# Patient Record
Sex: Male | Born: 1990 | Race: Black or African American | Hispanic: No | Marital: Single | Smoking: Never smoker
Health system: Southern US, Community
[De-identification: ages and names within clinical notes are randomized; demographics above are authoritative.]

---
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
## 2011-03-16 NOTE — Op Note (Signed)
NAME:  Andrew Camacho, BANKHEAD                          ACCOUNT NO.:  [id number]   MEDICAL RECORD NO.:  [id number]                   PATIENT TYPE:  OBV   LOCATION:  1849                                 FACILITY:  [facility]   PHYSICIAN:  Madlyn Frankel. Charlann Boxer, M.D.               DATE OF BIRTH:  1991/07/06   DATE OF PROCEDURE:  05/29/2004  DATE OF DISCHARGE:                                 OPERATIVE REPORT   PREOPERATIVE DIAGNOSIS:  Right great toe partial traumatic amputation, lawn  mower injury.   POSTOPERATIVE DIAGNOSIS:  Right great toe partial traumatic amputation, lawn  mower injury.   OPERATION PERFORMED:  Incision and drainage of skin and subcutaneous tissue  and bone with revision, amputation and primary wound closure.   SURGEON:  Madlyn Frankel. Charlann Boxer, M.D.   ASSISTANT:  None.   ANESTHESIA:  General.   ESTIMATED BLOOD LOSS:  Minimal.   COMPLICATIONS:  None apparent.   INDICATIONS FOR PROCEDURE:  [name] is a very pleasant 20 year old black  male who was cutting his yard today when he pulled the lawnmower back over  the top of his right great toe.  There was a partial amputation of the  dorsal distal aspect of the right great toe removing the base of the entire  nail bed and a portion of the distal phalanx.  He was brought to the  emergency room for evaluation.  Radiographs revealed a partial amputation of  the distal phalanx with complete loss of his nail bed on the great toe, no  other injury sustained.  After review of the risks and benefits with the  father for incision and drainage and primary closure versus secondary  closure, consent was obtained by the father.   DESCRIPTION OF PROCEDURE:  The patient received Unasyn 3 g IV as well as  gentamicin for his open wounds and fracture.  The patient was brought to the  operating theater.  Once adequate anesthesia was established, the right  lower extremity was prepped and draped in sterile fashion.  The skin was  circumferentially excised  around his wound.  Plan was to have a plantar flap  to be reapproximated to the dorsal skin edges in order for primary closure.  Debulking debridement was carried out of the subcutaneous tissue and fat as  well as partial debridement of the bone edges for debridement purposes as  well as for revision amputation purposes for allowing for primary closure.  Following this debridement and debulking of this great toe, the plantar  surface was able to be reapproximated to the dorsal skin tissue, primary  closure was carried out followed by 3-0 nylon.  Note that prior to the  closure, the wound was irrigated with 500 mL of normal saline and 500 mL of  bacitracin laden normal saline.  Following this, the wound was closed.  The  foot was then cleaned, dried and dressed sterilely with Xeroform dressing  sponges and bulky dressing.  The patient was then extubated and transferred  to recovery room in stable condition.   Postoperative plans will call for the patient to be on intravenous  antibiotics overnight including Unasyn, gentamicin and home on Augmentin for  10 days. Will watch closely his wound to make sure that it heals without  complication.                                               Madlyn Frankel Charlann Boxer, M.D.    MDO/MEDQ  D:  05/29/2004  T:  05/30/2004  Job:  [id number]

## 2016-11-19 DIAGNOSIS — K080 Exfoliation of teeth due to systemic causes: Secondary | ICD-10-CM | POA: Diagnosis not present

## 2016-12-10 DIAGNOSIS — K080 Exfoliation of teeth due to systemic causes: Secondary | ICD-10-CM | POA: Diagnosis not present

## 2017-08-01 DIAGNOSIS — K080 Exfoliation of teeth due to systemic causes: Secondary | ICD-10-CM | POA: Diagnosis not present

## 2017-09-13 DIAGNOSIS — R51 Headache: Secondary | ICD-10-CM | POA: Diagnosis not present

## 2017-10-14 ENCOUNTER — Ambulatory Visit (INDEPENDENT_AMBULATORY_CARE_PROVIDER_SITE_OTHER): Payer: Federal, State, Local not specified - PPO

## 2017-10-14 ENCOUNTER — Encounter (INDEPENDENT_AMBULATORY_CARE_PROVIDER_SITE_OTHER): Payer: Self-pay | Admitting: Orthopedic Surgery

## 2017-10-14 ENCOUNTER — Ambulatory Visit (INDEPENDENT_AMBULATORY_CARE_PROVIDER_SITE_OTHER): Payer: Federal, State, Local not specified - PPO | Admitting: Orthopedic Surgery

## 2017-10-14 DIAGNOSIS — M25511 Pain in right shoulder: Secondary | ICD-10-CM

## 2017-10-14 DIAGNOSIS — G8929 Other chronic pain: Secondary | ICD-10-CM

## 2017-10-19 ENCOUNTER — Encounter (INDEPENDENT_AMBULATORY_CARE_PROVIDER_SITE_OTHER): Payer: Self-pay | Admitting: Orthopedic Surgery

## 2017-10-19 NOTE — Progress Notes (Signed)
   Office Visit Note   Patient: Andrew Camacho           Date of Birth: 02/21/1991           MRN: [id number] Visit Date: 10/14/2017 Requested by: No referring provider defined for this encounter. PCP: Bean, Billie-Lynn, PA-C (Inactive)  Subjective: Chief Complaint  Patient presents with  . Right Shoulder - Pain    HPI: Andrew Camacho is a patient with right shoulder pain of one year duration.  Reports constant ache but does not wake with pain.  Does not take medication for the pain.  He reports clicking in the shoulder.  He likes to work out but he had to stop because of the pain.             ROS:All systems reviewed are negative as they relate to the chief complaint within the history of present illness.  Patient denies  fevers or chills.    Assessment & Plan: Visit Diagnoses:  1. Chronic right shoulder pain     Plan: Impression is right shoulder pain in a patient who has radiographic abnormality.  Plan is topical anti-inflammatory and MRI arthrogram to [facility].  He works at the [employer].  Weight lifting hurts him.  There is definitely an abnormality around the [facility].  I will see him back after that study..  Follow-Up Instructions: Return for after MRI.   Orders:  Orders Placed This Encounter  Procedures  . XR Shoulder Right   No orders of the defined types were placed in this encounter.     Procedures: No procedures performed   Clinical Data: No additional findings.  Objective: Vital Signs: There were no vitals taken for this visit.  Physical Exam:   Constitutional: Patient appears well-developed HEENT:  Head: Normocephalic Eyes:EOM are normal Neck: Normal range of motion Cardiovascular: Normal rate Pulmonary/chest: Effort normal Neurologic: Patient is alert Skin: Skin is warm Psychiatric: Patient has normal mood and affect    Ortho Exam: Orthopedic exam demonstrates excellent range of motion and strength of the right shoulder.  O'Brien's  testing is positive.  There is AC joint tenderness to direct palpation as well as significant spurring superiorly on the right side compared to the left.  No other masses lymphadenopathy or skin changes noted in the right shoulder girdle region.  Not much in the way of coarse grinding or crepitus present with shoulder range of motion.  I do not detect much in the way of mechanical symptoms with labral load testing.  Specialty Comments:  No specialty comments available.  Imaging: No results found.   PMFS History: Patient Active Problem List   Diagnosis Date Noted  . ALLERGIC RHINITIS 08/22/2007  . HEADACHE 08/22/2007   History reviewed. No pertinent past medical history.  History reviewed. No pertinent family history.  History reviewed. No pertinent surgical history. Social History   Occupational History  . Not on file  Tobacco Use  . Smoking status: Not on file  Substance and Sexual Activity  . Alcohol use: Not on file  . Drug use: Not on file  . Sexual activity: Not on file

## 2017-12-23 DIAGNOSIS — Z13220 Encounter for screening for lipoid disorders: Secondary | ICD-10-CM | POA: Diagnosis not present

## 2017-12-23 DIAGNOSIS — Z113 Encounter for screening for infections with a predominantly sexual mode of transmission: Secondary | ICD-10-CM | POA: Diagnosis not present

## 2017-12-23 DIAGNOSIS — Z0000 Encounter for general adult medical examination without abnormal findings: Secondary | ICD-10-CM | POA: Diagnosis not present

## 2017-12-24 DIAGNOSIS — Z113 Encounter for screening for infections with a predominantly sexual mode of transmission: Secondary | ICD-10-CM | POA: Diagnosis not present

## 2018-01-29 DIAGNOSIS — K080 Exfoliation of teeth due to systemic causes: Secondary | ICD-10-CM | POA: Diagnosis not present

## 2018-04-22 DIAGNOSIS — R079 Chest pain, unspecified: Secondary | ICD-10-CM | POA: Diagnosis not present

## 2018-09-17 DIAGNOSIS — K080 Exfoliation of teeth due to systemic causes: Secondary | ICD-10-CM | POA: Diagnosis not present

## 2018-10-27 DIAGNOSIS — J019 Acute sinusitis, unspecified: Secondary | ICD-10-CM | POA: Diagnosis not present

## 2018-12-01 DIAGNOSIS — R509 Fever, unspecified: Secondary | ICD-10-CM | POA: Diagnosis not present

## 2018-12-01 DIAGNOSIS — J069 Acute upper respiratory infection, unspecified: Secondary | ICD-10-CM | POA: Diagnosis not present

## 2018-12-01 DIAGNOSIS — J029 Acute pharyngitis, unspecified: Secondary | ICD-10-CM | POA: Diagnosis not present

## 2019-01-09 DIAGNOSIS — Z0000 Encounter for general adult medical examination without abnormal findings: Secondary | ICD-10-CM | POA: Diagnosis not present

## 2019-01-09 DIAGNOSIS — Z13220 Encounter for screening for lipoid disorders: Secondary | ICD-10-CM | POA: Diagnosis not present

## 2019-01-09 DIAGNOSIS — Z113 Encounter for screening for infections with a predominantly sexual mode of transmission: Secondary | ICD-10-CM | POA: Diagnosis not present

## 2019-03-18 DIAGNOSIS — K080 Exfoliation of teeth due to systemic causes: Secondary | ICD-10-CM | POA: Diagnosis not present

## 2019-08-17 DIAGNOSIS — H5340 Unspecified visual field defects: Secondary | ICD-10-CM | POA: Diagnosis not present

## 2019-09-02 DIAGNOSIS — Z20828 Contact with and (suspected) exposure to other viral communicable diseases: Secondary | ICD-10-CM | POA: Diagnosis not present

## 2019-09-03 DIAGNOSIS — Z20828 Contact with and (suspected) exposure to other viral communicable diseases: Secondary | ICD-10-CM | POA: Diagnosis not present

## 2020-04-18 DIAGNOSIS — B360 Pityriasis versicolor: Secondary | ICD-10-CM | POA: Diagnosis not present

## 2020-04-18 DIAGNOSIS — L218 Other seborrheic dermatitis: Secondary | ICD-10-CM | POA: Diagnosis not present

## 2020-06-14 DIAGNOSIS — Z20822 Contact with and (suspected) exposure to covid-19: Secondary | ICD-10-CM | POA: Diagnosis not present

## 2020-08-25 DIAGNOSIS — Z13220 Encounter for screening for lipoid disorders: Secondary | ICD-10-CM | POA: Diagnosis not present

## 2020-08-25 DIAGNOSIS — Z23 Encounter for immunization: Secondary | ICD-10-CM | POA: Diagnosis not present

## 2020-08-25 DIAGNOSIS — Z0000 Encounter for general adult medical examination without abnormal findings: Secondary | ICD-10-CM | POA: Diagnosis not present

## 2020-12-22 DIAGNOSIS — M9904 Segmental and somatic dysfunction of sacral region: Secondary | ICD-10-CM | POA: Diagnosis not present

## 2020-12-22 DIAGNOSIS — M9903 Segmental and somatic dysfunction of lumbar region: Secondary | ICD-10-CM | POA: Diagnosis not present

## 2020-12-22 DIAGNOSIS — M9907 Segmental and somatic dysfunction of upper extremity: Secondary | ICD-10-CM | POA: Diagnosis not present

## 2020-12-22 DIAGNOSIS — M9906 Segmental and somatic dysfunction of lower extremity: Secondary | ICD-10-CM | POA: Diagnosis not present

## 2020-12-26 DIAGNOSIS — M9907 Segmental and somatic dysfunction of upper extremity: Secondary | ICD-10-CM | POA: Diagnosis not present

## 2020-12-26 DIAGNOSIS — M9903 Segmental and somatic dysfunction of lumbar region: Secondary | ICD-10-CM | POA: Diagnosis not present

## 2020-12-26 DIAGNOSIS — M9904 Segmental and somatic dysfunction of sacral region: Secondary | ICD-10-CM | POA: Diagnosis not present

## 2020-12-26 DIAGNOSIS — M9906 Segmental and somatic dysfunction of lower extremity: Secondary | ICD-10-CM | POA: Diagnosis not present

## 2020-12-27 DIAGNOSIS — M9903 Segmental and somatic dysfunction of lumbar region: Secondary | ICD-10-CM | POA: Diagnosis not present

## 2020-12-27 DIAGNOSIS — M9906 Segmental and somatic dysfunction of lower extremity: Secondary | ICD-10-CM | POA: Diagnosis not present

## 2020-12-27 DIAGNOSIS — M9907 Segmental and somatic dysfunction of upper extremity: Secondary | ICD-10-CM | POA: Diagnosis not present

## 2020-12-27 DIAGNOSIS — M9904 Segmental and somatic dysfunction of sacral region: Secondary | ICD-10-CM | POA: Diagnosis not present

## 2020-12-29 DIAGNOSIS — M9907 Segmental and somatic dysfunction of upper extremity: Secondary | ICD-10-CM | POA: Diagnosis not present

## 2020-12-29 DIAGNOSIS — M9904 Segmental and somatic dysfunction of sacral region: Secondary | ICD-10-CM | POA: Diagnosis not present

## 2020-12-29 DIAGNOSIS — M9903 Segmental and somatic dysfunction of lumbar region: Secondary | ICD-10-CM | POA: Diagnosis not present

## 2020-12-29 DIAGNOSIS — M9906 Segmental and somatic dysfunction of lower extremity: Secondary | ICD-10-CM | POA: Diagnosis not present

## 2021-01-03 DIAGNOSIS — M9903 Segmental and somatic dysfunction of lumbar region: Secondary | ICD-10-CM | POA: Diagnosis not present

## 2021-01-03 DIAGNOSIS — M9907 Segmental and somatic dysfunction of upper extremity: Secondary | ICD-10-CM | POA: Diagnosis not present

## 2021-01-03 DIAGNOSIS — M9904 Segmental and somatic dysfunction of sacral region: Secondary | ICD-10-CM | POA: Diagnosis not present

## 2021-01-03 DIAGNOSIS — M9906 Segmental and somatic dysfunction of lower extremity: Secondary | ICD-10-CM | POA: Diagnosis not present

## 2021-01-09 DIAGNOSIS — M9907 Segmental and somatic dysfunction of upper extremity: Secondary | ICD-10-CM | POA: Diagnosis not present

## 2021-01-09 DIAGNOSIS — M9903 Segmental and somatic dysfunction of lumbar region: Secondary | ICD-10-CM | POA: Diagnosis not present

## 2021-01-09 DIAGNOSIS — M9904 Segmental and somatic dysfunction of sacral region: Secondary | ICD-10-CM | POA: Diagnosis not present

## 2021-01-09 DIAGNOSIS — M9906 Segmental and somatic dysfunction of lower extremity: Secondary | ICD-10-CM | POA: Diagnosis not present

## 2021-05-23 DIAGNOSIS — H0011 Chalazion right upper eyelid: Secondary | ICD-10-CM | POA: Diagnosis not present

## 2021-07-31 DIAGNOSIS — J0140 Acute pansinusitis, unspecified: Secondary | ICD-10-CM | POA: Diagnosis not present

## 2021-07-31 DIAGNOSIS — J029 Acute pharyngitis, unspecified: Secondary | ICD-10-CM | POA: Diagnosis not present

## 2021-08-28 DIAGNOSIS — Z0000 Encounter for general adult medical examination without abnormal findings: Secondary | ICD-10-CM | POA: Diagnosis not present

## 2021-08-28 DIAGNOSIS — Z13220 Encounter for screening for lipoid disorders: Secondary | ICD-10-CM | POA: Diagnosis not present

## 2023-10-16 ENCOUNTER — Encounter: Payer: Self-pay | Admitting: Family Medicine

## 2023-10-16 ENCOUNTER — Ambulatory Visit (INDEPENDENT_AMBULATORY_CARE_PROVIDER_SITE_OTHER): Payer: 59 | Admitting: Family Medicine

## 2023-10-16 VITALS — BP 117/69 | HR 58 | Temp 98.6°F | Resp 18 | Ht 70.0 in | Wt 215.0 lb

## 2023-10-16 DIAGNOSIS — Z13220 Encounter for screening for lipoid disorders: Secondary | ICD-10-CM | POA: Diagnosis not present

## 2023-10-16 DIAGNOSIS — Z7689 Persons encountering health services in other specified circumstances: Secondary | ICD-10-CM

## 2023-10-16 DIAGNOSIS — Z1329 Encounter for screening for other suspected endocrine disorder: Secondary | ICD-10-CM | POA: Insufficient documentation

## 2023-10-16 DIAGNOSIS — Z1159 Encounter for screening for other viral diseases: Secondary | ICD-10-CM | POA: Insufficient documentation

## 2023-10-16 DIAGNOSIS — Z13228 Encounter for screening for other metabolic disorders: Secondary | ICD-10-CM | POA: Diagnosis not present

## 2023-10-16 DIAGNOSIS — Z0000 Encounter for general adult medical examination without abnormal findings: Secondary | ICD-10-CM | POA: Diagnosis not present

## 2023-10-16 DIAGNOSIS — Z136 Encounter for screening for cardiovascular disorders: Secondary | ICD-10-CM

## 2023-10-16 NOTE — Progress Notes (Signed)
Complete physical exam  Patient: Andrew Camacho.   DOB: July 28, 1991   32 y.o. Male  MRN: [id number]  Subjective:    Chief Complaint  Patient presents with   Establish Care  Presents to establish care and to get a physical. He is fasting today.   Andrew Camacho. is a 32 y.o. male who presents today for a complete physical exam. He reports consuming a general and high protein   diet. Home exercise routine includes calisthenics and cardio and strength training . Gym/ health club routine includes cardio and mod to heavy weightlifting. He generally feels well. He reports sleeping well. He does not have additional problems to discuss today.    Most recent fall risk assessment:    10/16/2023    1:38 PM  Fall Risk   Falls in the past year? 0  Number falls in past yr: 0  Injury with Fall? 0  Risk for fall due to : No Fall Risks  Follow up Falls evaluation completed     Most recent depression screenings:    10/16/2023    1:39 PM  PHQ 2/9 Scores  PHQ - 2 Score 0  PHQ- 9 Score 0    Vision:Within last year and Dental: No current dental problems and Receives regular dental care    No care team member to display   No outpatient medications prior to visit.   No facility-administered medications prior to visit.    Review of Systems  Eyes:  Negative for blurred vision and double vision.  Respiratory:  Negative for shortness of breath.   Cardiovascular:  Negative for chest pain.  Gastrointestinal:  Negative for nausea and vomiting.  Neurological:  Negative for headaches.  Psychiatric/Behavioral:  Negative for depression and suicidal ideas. The patient does not have insomnia.           Objective:     BP 117/69   Pulse (!) 58   Temp 98.6 F (37 C) (Oral)   Resp 18   Ht 5\' 10"  (1.778 m)   Wt 215 lb (97.5 kg)   SpO2 99%   BMI 30.85 kg/m  BP Readings from Last 3 Encounters:  10/16/23 117/69      Physical Exam Vitals and nursing note reviewed.   Constitutional:      General: He is not in acute distress.    Appearance: Normal appearance.  HENT:     Right Ear: Tympanic membrane normal.     Left Ear: Tympanic membrane normal.     Nose: Nose normal.     Mouth/Throat:     Mouth: Mucous membranes are moist.     Pharynx: Oropharynx is clear.  Eyes:     Extraocular Movements: Extraocular movements intact.  Neck:     Thyroid: No thyroid tenderness.  Cardiovascular:     Rate and Rhythm: Normal rate and regular rhythm.     Pulses:          Radial pulses are 2+ on the right side and 2+ on the left side.     Heart sounds: Normal heart sounds, S1 normal and S2 normal.  Pulmonary:     Effort: Pulmonary effort is normal.     Breath sounds: Normal breath sounds.  Abdominal:     General: Bowel sounds are normal.     Palpations: Abdomen is soft.     Tenderness: There is no abdominal tenderness.  Musculoskeletal:        General: Normal range of motion.  Cervical back: Normal range of motion.     Right lower leg: No edema.     Left lower leg: No edema.  Lymphadenopathy:     Cervical:     Right cervical: No superficial cervical adenopathy.    Left cervical: No superficial cervical adenopathy.  Skin:    General: Skin is warm and dry.  Neurological:     General: No focal deficit present.     Mental Status: He is alert. Mental status is at baseline.  Psychiatric:        Mood and Affect: Mood normal.        Behavior: Behavior normal.        Thought Content: Thought content normal.        Judgment: Judgment normal.      No results found for any visits on 10/16/23.     Assessment & Plan:    Routine Health Maintenance and Physical Exam   There is no immunization history on file for this patient.  Health Maintenance  Topic Date Due   HIV Screening  Never done   Hepatitis C Screening  Never done   COVID-19 Vaccine (1 - 2024-25 season) 11/01/2023 (Originally 06/30/2023)   INFLUENZA VACCINE  01/27/2024 (Originally 05/30/2023)    DTaP/Tdap/Td (1 - Tdap) 10/15/2024 (Originally 06/30/2008)   HPV VACCINES  Aged Out    Discussed health benefits of physical activity, and encouraged him to engage in regular exercise appropriate for his age and condition.  Annual physical exam -     Comprehensive metabolic panel -     CBC with Differential/Platelet  Establishing care with new doctor, encounter for  Encounter for lipid screening for cardiovascular disease -     Lipid panel  Encounter for screening for metabolic disorder -     Comprehensive metabolic panel -     Hemoglobin A1c -     TSH + free T4  Need for hepatitis C screening test -     Hepatitis C antibody  Screening for viral disease -     HIV Antibody (routine testing w rflx)  Screening for thyroid disorder -     TSH + free T4      Routine labs ordered.  HCM reviewed/discussed. Hep C and HIV ordered today.  Anticipatory guidance regarding healthy weight, lifestyle and choices given. Recommend healthy diet.  Recommend approximately 150 minutes/week of moderate intensity exercise. Resistance training is good for building muscles and for bone health. Muscle mass helps to increase our metabolism and to burn more calories at rest.  Limit alcohol consumption: no more than one drink per day for women and 2 drinks per day for me. Recommend regular dental and vision exams. Always use seatbelt/lap and shoulder restraints. Recommend using smoke alarms and checking batteries at least twice a year. Recommend using sunscreen when outside.  Please know that I am here to help you with all of your health care goals and am happy to work with you to find a solution that works best for you.  The greatest advice I have received with any changes in life are to take it one step at a time, that even means if all you can focus on is the next 60 seconds, then do that and celebrate your victories.  With any changes in life, you will have set backs, and that is OK. The important  thing to remember is, if you have a set back, it is not a failure, it is an opportunity to try  again! Agrees with plan of care discussed.  Questions answered.      Return in about 1 year (around 10/15/2024) for CPE with labs.     Novella Olive, FNP

## 2023-10-17 LAB — COMPREHENSIVE METABOLIC PANEL
ALT: 27 [IU]/L (ref 0–44)
AST: 23 [IU]/L (ref 0–40)
Albumin: 4.8 g/dL (ref 4.1–5.1)
Alkaline Phosphatase: 46 [IU]/L (ref 44–121)
BUN/Creatinine Ratio: 11 (ref 9–20)
BUN: 13 mg/dL (ref 6–20)
Bilirubin Total: 0.7 mg/dL (ref 0.0–1.2)
CO2: 23 mmol/L (ref 20–29)
Calcium: 9.7 mg/dL (ref 8.7–10.2)
Chloride: 102 mmol/L (ref 96–106)
Creatinine, Ser: 1.16 mg/dL (ref 0.76–1.27)
Globulin, Total: 2.4 g/dL (ref 1.5–4.5)
Glucose: 95 mg/dL (ref 70–99)
Potassium: 4.3 mmol/L (ref 3.5–5.2)
Sodium: 141 mmol/L (ref 134–144)
Total Protein: 7.2 g/dL (ref 6.0–8.5)
eGFR: 85 mL/min/{1.73_m2} (ref >59)

## 2023-10-17 LAB — HEMOGLOBIN A1C
Est. average glucose Bld gHb Est-mCnc: 100 mg/dL
Hgb A1c MFr Bld: 5.1 % (ref 4.8–5.6)

## 2023-10-17 LAB — LIPID PANEL
Chol/HDL Ratio: 2.8 {ratio} (ref 0.0–5.0)
Cholesterol, Total: 168 mg/dL (ref 100–199)
HDL: 59 mg/dL (ref >39)
LDL Chol Calc (NIH): 97 mg/dL (ref 0–99)
Triglycerides: 64 mg/dL (ref 0–149)
VLDL Cholesterol Cal: 12 mg/dL (ref 5–40)

## 2023-10-17 LAB — CBC WITH DIFFERENTIAL/PLATELET
Basophils Absolute: 0 10*3/uL (ref 0.0–0.2)
Basos: 0 %
EOS (ABSOLUTE): 0.1 10*3/uL (ref 0.0–0.4)
Eos: 1 %
Hematocrit: 42.2 % (ref 37.5–51.0)
Hemoglobin: 13.3 g/dL (ref 13.0–17.7)
Immature Grans (Abs): 0 10*3/uL (ref 0.0–0.1)
Immature Granulocytes: 0 %
Lymphocytes Absolute: 1.8 10*3/uL (ref 0.7–3.1)
Lymphs: 36 %
MCH: 26.2 pg — ABNORMAL LOW (ref 26.6–33.0)
MCHC: 31.5 g/dL (ref 31.5–35.7)
MCV: 83 fL (ref 79–97)
Monocytes Absolute: 0.4 10*3/uL (ref 0.1–0.9)
Monocytes: 8 %
Neutrophils Absolute: 2.7 10*3/uL (ref 1.4–7.0)
Neutrophils: 55 %
Platelets: 273 10*3/uL (ref 150–450)
RBC: 5.07 x10E6/uL (ref 4.14–5.80)
RDW: 11.8 % (ref 11.6–15.4)
WBC: 5 10*3/uL (ref 3.4–10.8)

## 2023-10-17 LAB — HIV ANTIBODY (ROUTINE TESTING W REFLEX): HIV Screen 4th Generation wRfx: NONREACTIVE

## 2023-10-17 LAB — HEPATITIS C ANTIBODY: Hep C Virus Ab: NONREACTIVE

## 2023-10-17 LAB — TSH+FREE T4
Free T4: 1.19 ng/dL (ref 0.82–1.77)
TSH: 0.709 u[IU]/mL (ref 0.450–4.500)

## 2024-10-15 ENCOUNTER — Encounter: Payer: 59 | Admitting: Family Medicine

## 2024-10-15 ENCOUNTER — Ambulatory Visit: Payer: 59 | Admitting: Family Medicine

## 2024-10-15 ENCOUNTER — Encounter: Payer: Self-pay | Admitting: Family Medicine

## 2024-10-15 VITALS — BP 126/74 | HR 69 | Temp 98.0°F | Ht 71.0 in | Wt 212.6 lb

## 2024-10-15 DIAGNOSIS — Z13220 Encounter for screening for lipoid disorders: Secondary | ICD-10-CM | POA: Insufficient documentation

## 2024-10-15 DIAGNOSIS — Z136 Encounter for screening for cardiovascular disorders: Secondary | ICD-10-CM

## 2024-10-15 DIAGNOSIS — Z13228 Encounter for screening for other metabolic disorders: Secondary | ICD-10-CM | POA: Diagnosis not present

## 2024-10-15 DIAGNOSIS — Z130 Encounter for screening for diseases of the blood and blood-forming organs and certain disorders involving the immune mechanism: Secondary | ICD-10-CM | POA: Diagnosis not present

## 2024-10-15 DIAGNOSIS — Z0000 Encounter for general adult medical examination without abnormal findings: Secondary | ICD-10-CM | POA: Insufficient documentation

## 2024-10-15 DIAGNOSIS — Z1321 Encounter for screening for nutritional disorder: Secondary | ICD-10-CM | POA: Diagnosis not present

## 2024-10-15 NOTE — Progress Notes (Signed)
 Complete physical exam  Patient: Andrew Camacho.   DOB: 07/29/1991   33 y.o. Male  MRN: [id number]  Subjective:    Chief Complaint  Patient presents with   Annual Exam    Andrew Camacho. is a 33 y.o. male who presents today for a complete physical exam. He reports consuming a special renal with his father, general  diet. Basketball and weight training. He generally feels well. He reports sleeping well. He does not have additional problems to discuss today.    Most recent fall risk assessment:    10/15/2024    1:46 PM  Fall Risk   Falls in the past year? 0  Number falls in past yr: 0  Injury with Fall? 0  Risk for fall due to : No Fall Risks  Follow up Falls evaluation completed     Most recent depression screenings:    10/15/2024    1:46 PM 10/16/2023    1:39 PM  PHQ 2/9 Scores  PHQ - 2 Score 0 0  PHQ- 9 Score 0 0      Data saved with a previous flowsheet row definition    Vision:Within last year and Dental: No current dental problems and Receives regular dental care    Patient Care Team: Booker Darice SAUNDERS, FNP as PCP - General (Family Medicine)   Show/hide medication list[1]  ROS        Objective:     BP 126/74 (Patient Position: Sitting, Cuff Size: Large)   Pulse 69   Temp 98 F (36.7 C) (Oral)   Ht 5' 11 (1.803 m)   Wt 212 lb 9.6 oz (96.4 kg)   SpO2 99%   BMI 29.65 kg/m    Physical Exam Vitals and nursing note reviewed.  Constitutional:      General: He is not in acute distress.    Appearance: Normal appearance.  HENT:     Right Ear: Tympanic membrane normal.     Left Ear: Tympanic membrane normal.     Nose: Nose normal.     Mouth/Throat:     Mouth: Mucous membranes are moist.     Pharynx: Oropharynx is clear.  Eyes:     Extraocular Movements: Extraocular movements intact.  Neck:     Thyroid: No thyroid tenderness.  Cardiovascular:     Rate and Rhythm: Normal rate and regular rhythm.     Pulses:          Radial pulses  are 2+ on the right side and 2+ on the left side.     Heart sounds: Normal heart sounds, S1 normal and S2 normal.  Pulmonary:     Effort: Pulmonary effort is normal.     Breath sounds: Normal breath sounds.  Abdominal:     General: Bowel sounds are normal.     Palpations: Abdomen is soft.     Tenderness: There is no abdominal tenderness.  Musculoskeletal:        General: Normal range of motion.     Cervical back: Normal range of motion.     Right lower leg: No edema.     Left lower leg: No edema.  Lymphadenopathy:     Cervical:     Right cervical: No superficial cervical adenopathy.    Left cervical: No superficial cervical adenopathy.  Skin:    General: Skin is warm and dry.  Neurological:     General: No focal deficit present.     Mental Status: He is  alert. Mental status is at baseline.  Psychiatric:        Mood and Affect: Mood normal.        Behavior: Behavior normal.        Thought Content: Thought content normal.        Judgment: Judgment normal.     No results found for any visits on 10/15/24.     Assessment & Plan:    Routine Health Maintenance and Physical Exam  Immunization History  Administered Date(s) Administered   Janssen (J&J) SARS-COV-2 Vaccination 01/30/2020, 10/04/2020   Tdap 01/28/2012, 08/25/2020    Health Maintenance  Topic Date Due   Hepatitis B Vaccines 19-59 Average Risk (1 of 3 - 19+ 3-dose series) Never done   HPV VACCINES (1 - 3-dose SCDM series) Never done   Influenza Vaccine  Never done   COVID-19 Vaccine (3 - 2025-26 season) 06/29/2024   DTaP/Tdap/Td (3 - Td or Tdap) 08/25/2030   Hepatitis C Screening  Completed   HIV Screening  Completed   Pneumococcal Vaccine  Aged Out   Meningococcal B Vaccine  Aged Out    Discussed health benefits of physical activity, and encouraged him to engage in regular exercise appropriate for his age and condition.  Problem List Items Addressed This Visit     Annual physical exam - Primary    Relevant Orders   CBC   Comprehensive metabolic panel with GFR   Hemoglobin A1c   Lipid panel   TSH + free T4   Encounter for lipid screening for cardiovascular disease   Relevant Orders   Lipid panel   Encounter for screening for metabolic disorder   Relevant Orders   Comprehensive metabolic panel with GFR   Hemoglobin A1c   TSH + free T4   Screening for deficiency anemia   Relevant Orders   CBC   Encounter for vitamin deficiency screening   Relevant Orders   VITAMIN D 25 Hydroxy (Vit-D Deficiency, Fractures)    Routine labs ordered.  HCM reviewed/discussed. Declines flu vaccine today.  Anticipatory guidance regarding healthy weight, lifestyle and choices given. Recommend healthy diet.  Recommend approximately 150 minutes/week of moderate intensity exercise. Resistance training is good for building muscles and for bone health. Muscle mass helps to increase our metabolism and to burn more calories at rest.  Limit alcohol consumption: no more than one drink per day for women and 2 drinks per day for me. Recommend regular dental and vision exams. Always use seatbelt/lap and shoulder restraints. Recommend using smoke alarms and checking batteries at least twice a year. Recommend using sunscreen when outside. Agrees with plan of care discussed.  Questions answered.      Return in about 1 year (around 10/18/2025) for CPE with labs.     Darice JONELLE Brownie, FNP     [1]  No outpatient medications prior to visit.   No facility-administered medications prior to visit.

## 2024-10-17 LAB — LIPID PANEL
Chol/HDL Ratio: 2.9 ratio (ref 0.0–5.0)
Cholesterol, Total: 175 mg/dL (ref 100–199)
HDL: 61 mg/dL
LDL Chol Calc (NIH): 101 mg/dL — ABNORMAL HIGH (ref 0–99)
Triglycerides: 68 mg/dL (ref 0–149)
VLDL Cholesterol Cal: 13 mg/dL (ref 5–40)

## 2024-10-17 LAB — CBC
Hematocrit: 45.2 % (ref 37.5–51.0)
Hemoglobin: 14.2 g/dL (ref 13.0–17.7)
MCH: 26.1 pg — ABNORMAL LOW (ref 26.6–33.0)
MCHC: 31.4 g/dL — ABNORMAL LOW (ref 31.5–35.7)
MCV: 83 fL (ref 79–97)
Platelets: 290 x10E3/uL (ref 150–450)
RBC: 5.44 x10E6/uL (ref 4.14–5.80)
RDW: 11.8 % (ref 11.6–15.4)
WBC: 5.3 x10E3/uL (ref 3.4–10.8)

## 2024-10-17 LAB — COMPREHENSIVE METABOLIC PANEL WITH GFR
ALT: 28 IU/L (ref 0–44)
AST: 26 IU/L (ref 0–40)
Albumin: 5 g/dL (ref 4.1–5.1)
Alkaline Phosphatase: 46 IU/L — ABNORMAL LOW (ref 47–123)
BUN/Creatinine Ratio: 13 (ref 9–20)
BUN: 20 mg/dL (ref 6–20)
Bilirubin Total: 0.8 mg/dL (ref 0.0–1.2)
CO2: 22 mmol/L (ref 20–29)
Calcium: 10.1 mg/dL (ref 8.7–10.2)
Chloride: 101 mmol/L (ref 96–106)
Creatinine, Ser: 1.53 mg/dL — ABNORMAL HIGH (ref 0.76–1.27)
Globulin, Total: 3 g/dL (ref 1.5–4.5)
Glucose: 83 mg/dL (ref 70–99)
Potassium: 4.1 mmol/L (ref 3.5–5.2)
Sodium: 140 mmol/L (ref 134–144)
Total Protein: 8 g/dL (ref 6.0–8.5)
eGFR: 60 mL/min/1.73

## 2024-10-17 LAB — TSH+FREE T4
Free T4: 1.17 ng/dL (ref 0.82–1.77)
TSH: 0.924 u[IU]/mL (ref 0.450–4.500)

## 2024-10-17 LAB — HEMOGLOBIN A1C
Est. average glucose Bld gHb Est-mCnc: 97 mg/dL
Hgb A1c MFr Bld: 5 % (ref 4.8–5.6)

## 2024-10-17 LAB — VITAMIN D 25 HYDROXY (VIT D DEFICIENCY, FRACTURES): Vit D, 25-Hydroxy: 46.1 ng/mL (ref 30.0–100.0)

## 2024-10-18 ENCOUNTER — Ambulatory Visit: Payer: Self-pay | Admitting: Family Medicine

## 2024-10-18 DIAGNOSIS — R7989 Other specified abnormal findings of blood chemistry: Secondary | ICD-10-CM | POA: Insufficient documentation

## 2024-11-05 LAB — BASIC METABOLIC PANEL WITH GFR
BUN/Creatinine Ratio: 13 (ref 9–20)
BUN: 16 mg/dL (ref 6–20)
CO2: 25 mmol/L (ref 20–29)
Calcium: 9.8 mg/dL (ref 8.7–10.2)
Chloride: 99 mmol/L (ref 96–106)
Creatinine, Ser: 1.19 mg/dL (ref 0.76–1.27)
Glucose: 92 mg/dL (ref 70–99)
Potassium: 4.2 mmol/L (ref 3.5–5.2)
Sodium: 138 mmol/L (ref 134–144)
eGFR: 82 mL/min/1.73
